# Patient Record
Sex: Female | Born: 1990 | Race: White | Hispanic: No | Marital: Married | Smoking: Never smoker
Health system: Southern US, Community
[De-identification: ages and names within clinical notes are randomized; demographics above are authoritative.]

## PROBLEM LIST (undated history)

## (undated) DIAGNOSIS — N83209 Unspecified ovarian cyst, unspecified side: Secondary | ICD-10-CM

## (undated) DIAGNOSIS — Z8619 Personal history of other infectious and parasitic diseases: Secondary | ICD-10-CM

## (undated) HISTORY — PX: NO PAST SURGERIES: SHX2092

## (undated) HISTORY — DX: Personal history of other infectious and parasitic diseases: Z86.19

---

## 2013-12-10 ENCOUNTER — Encounter (HOSPITAL_COMMUNITY): Payer: Self-pay | Admitting: *Deleted

## 2013-12-10 ENCOUNTER — Inpatient Hospital Stay (HOSPITAL_COMMUNITY): Payer: BC Managed Care – PPO

## 2013-12-10 ENCOUNTER — Inpatient Hospital Stay (HOSPITAL_COMMUNITY)
Admission: AD | Admit: 2013-12-10 | Discharge: 2013-12-10 | Disposition: A | Discharge status: Home / Self Care | Payer: BC Managed Care – PPO | Source: Ambulatory Visit | Attending: Family Medicine | Admitting: Family Medicine

## 2013-12-10 DIAGNOSIS — O209 Hemorrhage in early pregnancy, unspecified: Secondary | ICD-10-CM | POA: Insufficient documentation

## 2013-12-10 DIAGNOSIS — O4690 Antepartum hemorrhage, unspecified, unspecified trimester: Secondary | ICD-10-CM

## 2013-12-10 HISTORY — DX: Unspecified ovarian cyst, unspecified side: N83.209

## 2013-12-10 LAB — CBC
HCT: 36.9 % (ref 36.0–46.0)
HEMOGLOBIN: 12.8 g/dL (ref 12.0–15.0)
MCH: 31.9 pg (ref 26.0–34.0)
MCHC: 34.7 g/dL (ref 30.0–36.0)
MCV: 92 fL (ref 78.0–100.0)
Platelets: 298 10*3/uL (ref 150–400)
RBC: 4.01 MIL/uL (ref 3.87–5.11)
RDW: 12.6 % (ref 11.5–15.5)
WBC: 4.6 10*3/uL (ref 4.0–10.5)

## 2013-12-10 LAB — WET PREP, GENITAL
Clue Cells Wet Prep HPF POC: NONE SEEN
TRICH WET PREP: NONE SEEN
WBC WET PREP: NONE SEEN
Yeast Wet Prep HPF POC: NONE SEEN

## 2013-12-10 LAB — HCG, QUANTITATIVE, PREGNANCY: hCG, Beta Chain, Quant, S: 29 m[IU]/mL — ABNORMAL HIGH (ref <5)

## 2013-12-10 LAB — ABO/RH: ABO/RH(D): A POS

## 2013-12-10 NOTE — Discharge Instructions (Signed)
Vaginal Bleeding During Pregnancy, First Trimester  A small amount of bleeding (spotting) from the vagina is relatively common in early pregnancy. It usually stops on its own. Various things may cause bleeding or spotting in early pregnancy. Some bleeding may be related to the pregnancy, and some may not. In most cases, the bleeding is normal and is not a problem. However, bleeding can also be a sign of something serious. Be sure to tell your health care provider about any vaginal bleeding right away.  Some possible causes of vaginal bleeding during the first trimester include:  · Infection or inflammation of the cervix.  · Growths (polyps) on the cervix.  · Miscarriage or threatened miscarriage.  · Pregnancy tissue has developed outside of the uterus and in a fallopian tube (tubal pregnancy).  · Tiny cysts have developed in the uterus instead of pregnancy tissue (molar pregnancy).  HOME CARE INSTRUCTIONS   Watch your condition for any changes. The following actions may help to lessen any discomfort you are feeling:  · Follow your health care provider's instructions for limiting your activity. If your health care provider orders bed rest, you may need to stay in bed and only get up to use the bathroom. However, your health care provider may allow you to continue light activity.  · If needed, make plans for someone to help with your regular activities and responsibilities while you are on bed rest.  · Keep track of the number of pads you use each day, how often you change pads, and how soaked (saturated) they are. Write this down.  · Do not use tampons. Do not douche.  · Do not have sexual intercourse or orgasms until approved by your health care provider.  · If you pass any tissue from your vagina, save the tissue so you can show it to your health care provider.  · Only take over-the-counter or prescription medicines as directed by your health care provider.  · Do not take aspirin because it can make you  bleed.  · Keep all follow-up appointments as directed by your health care provider.  SEEK MEDICAL CARE IF:  · You have any vaginal bleeding during any part of your pregnancy.  · You have cramps or labor pains.  · You have a fever, not controlled by medicine.  SEEK IMMEDIATE MEDICAL CARE IF:   · You have severe cramps in your back or belly (abdomen).  · You pass large clots or tissue from your vagina.  · Your bleeding increases.  · You feel light-headed or weak, or you have fainting episodes.  · You have chills.  · You are leaking fluid or have a gush of fluid from your vagina.  · You pass out while having a bowel movement.  MAKE SURE YOU:  · Understand these instructions.  · Will watch your condition.  · Will get help right away if you are not doing well or get worse.  Document Released: 12/13/2004 Document Revised: 03/10/2013 Document Reviewed: 11/10/2012  ExitCare® Patient Information ©2015 [employer]. This information is not intended to replace advice given to you by your health care provider. Make sure you discuss any questions you have with your health care provider.

## 2013-12-10 NOTE — MAU Provider Note (Signed)
Chief Complaint: Vaginal Bleeding   First Provider Initiated Contact with Patient 12/10/13 0954     SUBJECTIVE HPI: Colleen Barrett is a 23 y.o. G0P0 at 5.4 weeks by certain, regular LMP who presents with pos home UPT x 3 w/  Bleeding and passing clots yesterday, lighter bleeding today. Denies abd pain.   Past Medical History  Diagnosis Date  . Ruptured ovarian cyst    OB History  Gravida Para Term Preterm AB SAB TAB Ectopic Multiple Living  1         0    # Outcome Date GA Lbr Len/2nd Weight Sex Delivery Anes PTL Lv  1 CUR              Past Surgical History  Procedure Laterality Date  . No past surgeries     History   Social History  . Marital Status: Married    Spouse Name: N/A    Number of Children: N/A  . Years of Education: N/A   Occupational History  . Not on file.   Social History Main Topics  . Smoking status: Never Smoker   . Smokeless tobacco: Not on file  . Alcohol Use: Yes     Comment: socially  . Drug Use: No  . Sexual Activity: Yes    Birth Control/ Protection: None   Other Topics Concern  . Not on file   Social History Narrative  . No narrative on file   No current facility-administered medications on file prior to encounter.   No current outpatient prescriptions on file prior to encounter.   No Known Allergies  ROS: Pertinent items in HPI  OBJECTIVE Blood pressure 118/75, pulse 79, temperature 98.4 F (36.9 C), temperature source Oral, resp. rate 16, height  (1.651 m), weight 63.504 kg (140 lb), last menstrual period 11/01/2013, SpO2 100.00%. GENERAL: Well-developed, well-nourished female in no acute distress.  HEENT: Normocephalic HEART: normal rate RESP: normal effort ABDOMEN: Soft, non-tender EXTREMITIES: Nontender, no edema NEURO: Alert and oriented SPECULUM EXAM: NEFG, small amount of bright red blood noted, cervix clean BIMANUAL: cervix closed; uterus normal size, no adnexal tenderness or masses. No CMT.   LAB  RESULTS Results for orders placed during the hospital encounter of 12/10/13 (from the past 24 hour(s))  HCG, QUANTITATIVE, PREGNANCY     Status: Abnormal   Collection Time    12/10/13 10:00 AM      Result Value Ref Range   hCG, Beta Chain, Quant, S 29 (*) <5 mIU/mL  CBC     Status: None   Collection Time    12/10/13 10:00 AM      Result Value Ref Range   WBC 4.6  4.0 - 10.5 K/uL   RBC 4.01  3.87 - 5.11 MIL/uL   Hemoglobin 12.8  12.0 - 15.0 g/dL   HCT 40.9  81.1 - 91.4 %   MCV 92.0  78.0 - 100.0 fL   MCH 31.9  26.0 - 34.0 pg   MCHC 34.7  30.0 - 36.0 g/dL   RDW 78.2  95.6 - 21.3 %   Platelets 298  150 - 400 K/uL  ABO/RH     Status: None   Collection Time    12/10/13 10:00 AM      Result Value Ref Range   ABO/RH(D) A POS    WET PREP, GENITAL     Status: None   Collection Time    12/10/13 12:15 PM      Result Value Ref  Range   Yeast Wet Prep HPF POC NONE SEEN  NONE SEEN   Trich, Wet Prep NONE SEEN  NONE SEEN   Clue Cells Wet Prep HPF POC NONE SEEN  NONE SEEN   WBC, Wet Prep HPF POC NONE SEEN  NONE SEEN    IMAGING US Ob Comp Less 14 Wks  12/10/2013   CLINICAL DATA:  Pregnancy.  Bleeding.  EXAM: OBSTETRIC <14 WK [place] AND TRANSVAGINAL OB US  TECHNIQUE: Both transabdominal and transvaginal ultrasound examinations were performed for complete evaluation of the gestation as well as the maternal uterus, adnexal regions, and pelvic cul-de-sac. Transvaginal technique was performed to assess early pregnancy.  COMPARISON:  None.  FINDINGS: Intrauterine gestational sac: None visualized.  Yolk sac:  None visualized.  Embryo:  None visualized.  Maternal uterus/adnexae: No significant abnormality. Critical tiny corpus luteal cyst on the right. No free pelvic fluid.  IMPRESSION: No intrauterine pregnancy identified. Findings are suspicious but not yet definitive for failed pregnancy. Recommend follow-up US in 10-14 days for definitive diagnosis. This recommendation follows SRU consensus guidelines:  Diagnostic Criteria for Nonviable Pregnancy Early in the First Trimester. Malva Limes Med 2013; 161:0960-45.   Electronically Signed   By: Maisie Fus  Register   On: 12/10/2013 11:54   US Ob Transvaginal  12/10/2013   CLINICAL DATA:  Pregnancy.  Bleeding.  EXAM: OBSTETRIC <14 WK [place] AND TRANSVAGINAL OB US  TECHNIQUE: Both transabdominal and transvaginal ultrasound examinations were performed for complete evaluation of the gestation as well as the maternal uterus, adnexal regions, and pelvic cul-de-sac. Transvaginal technique was performed to assess early pregnancy.  COMPARISON:  None.  FINDINGS: Intrauterine gestational sac: None visualized.  Yolk sac:  None visualized.  Embryo:  None visualized.  Maternal uterus/adnexae: No significant abnormality. Critical tiny corpus luteal cyst on the right. No free pelvic fluid.  IMPRESSION: No intrauterine pregnancy identified. Findings are suspicious but not yet definitive for failed pregnancy. Recommend follow-up US in 10-14 days for definitive diagnosis. This recommendation follows SRU consensus guidelines: Diagnostic Criteria for Nonviable Pregnancy Early in the First Trimester. Malva Limes Med 2013; 409:8119-14.   Electronically Signed   By: Maisie Fus  Register   On: 12/10/2013 11:54    MAU COURSE  ASSESSMENT 1. Vaginal bleeding during pregnancy, antepartum     PLAN Discharge home in stable condition. SAB and ectopic precautions. Bleeding precautions     Follow-up Information   Follow up with [facility]. (in 2 days or sooner as needed in emergencies)    Contact information:   [address] [id number] [place] [address] [phone]       Medication List         prenatal multivitamin Tabs tablet  Take 1 tablet by mouth daily at 12 noon.         [place], [place] 12/10/2013  12:47 PM

## 2013-12-10 NOTE — MAU Note (Signed)
Pt states she has had positive home UPT x 3 last week.  Started experiencing vaginal bleeding yesterday afternoon and passed some clots throughout the night.  Last intercourse was Tues and bleeding began on Wed.

## 2013-12-10 NOTE — MAU Provider Note (Signed)
Attestation of Attending Supervision of Advanced Practitioner (PA/CNM/NP): Evaluation and management procedures were performed by the Advanced Practitioner under my supervision and collaboration.  I have reviewed the Advanced Practitioner's note and chart, and I agree with the management and plan.  Bookert Guzzi S, MD [facility] Attending 12/10/2013 5:18 PM   

## 2013-12-11 LAB — GC/CHLAMYDIA PROBE AMP
CT Probe RNA: NEGATIVE
GC Probe RNA: NEGATIVE

## 2013-12-12 ENCOUNTER — Inpatient Hospital Stay (HOSPITAL_COMMUNITY)
Admission: AD | Admit: 2013-12-12 | Discharge: 2013-12-12 | Disposition: A | Discharge status: Home / Self Care | Payer: BC Managed Care – PPO | Source: Ambulatory Visit | Attending: Obstetrics & Gynecology | Admitting: Obstetrics & Gynecology

## 2013-12-12 DIAGNOSIS — O039 Complete or unspecified spontaneous abortion without complication: Secondary | ICD-10-CM | POA: Insufficient documentation

## 2013-12-12 DIAGNOSIS — O209 Hemorrhage in early pregnancy, unspecified: Secondary | ICD-10-CM | POA: Diagnosis present

## 2013-12-12 LAB — HCG, QUANTITATIVE, PREGNANCY: HCG, BETA CHAIN, QUANT, S: 5 m[IU]/mL — AB (ref <5)

## 2013-12-12 NOTE — MAU Provider Note (Signed)
S:  Colleen Barrett is a 23 y.o. female G1P0 at [redacted]w[redacted]d who presents for a follow up beta hcg level. She was seen two days ago in MAU with vaginal bleeding. She currently denies pain, however continues to have light vaginal bleeding.    O:  GENERAL: Well-developed, well-nourished female in no acute distress, accompanied by her significant other.  HEENT: Normocephalic, atraumatic.   LUNGS: Effort normal HEART: Regular rate  SKIN: Warm, dry and without erythema PSYCH: Normal mood and affect  Filed Vitals:   12/12/13 1052  BP: 121/68  Pulse: 67  Temp: 97.9 F (36.6 C)  Resp: 18    Results for orders placed during the hospital encounter of 12/12/13 (from the past 48 hour(s))  HCG, QUANTITATIVE, PREGNANCY     Status: Abnormal   Collection Time    12/12/13 10:28 AM      Result Value Ref Range   hCG, Beta Chain, Quant, S 5 (*) <5 mIU/mL   Comment:              GEST. AGE      CONC.  (mIU/mL)       <=1 WEEK        5 - 50         2 WEEKS       50 - 500         3 WEEKS       100 - 10,000         4 WEEKS     1,000 - 30,000         5 WEEKS     3,500 - 115,000       6-8 WEEKS     12,000 - 270,000        12 WEEKS     15,000 - 220,000                FEMALE AND NON-PREGNANT FEMALE:         LESS THAN 5 mIU/mL     MDM: Beta hcg 9/24: 29 O positive blood type    A:  1. SAB (spontaneous abortion)     P:  Discharge home in stable condition Bleeding precautions Support given Follow up in the clinic in 1-2 weeks. Return to MAU if symptoms worsen    Debbrah Alar, NP. 11:25 AM 12/12/2013

## 2013-12-12 NOTE — MAU Provider Note (Signed)
Attestation of Attending Supervision of Advanced Practitioner (CNM/NP): Evaluation and management procedures were performed by the Advanced Practitioner under my supervision and collaboration.  I have reviewed the Advanced Practitioner's note and chart, and I agree with the management and plan.  HARRAWAY-SMITH, Montario Zilka 2:32 PM

## 2013-12-12 NOTE — Discharge Instructions (Signed)
Miscarriage °A miscarriage is the loss of an unborn baby (fetus) before the 20th week of pregnancy. The cause is often unknown.  °HOME CARE °· You may need to stay in bed (bed rest), or you may be able to do light activity. Go about activity as told by your doctor. °· Have help at home. °· Write down how many pads you use each day. Write down how soaked they are. °· Do not use tampons. Do not wash out your vagina (douche) or have sex (intercourse) until your doctor approves. °· Only take medicine as told by your doctor. °· Do not take aspirin. °· Keep all doctor visits as told. °· If you or your partner have problems with grieving, talk to your doctor. You can also try counseling. Give yourself time to grieve before trying to get pregnant again. °GET HELP RIGHT AWAY IF: °· You have bad cramps or pain in your back or belly (abdomen). °· You have a fever. °· You pass large clumps of blood (clots) from your vagina that are walnut-sized or larger. Save the clumps for your doctor to see. °· You pass large amounts of tissue from your vagina. Save the tissue for your doctor to see. °· You have more bleeding. °· You have thick, bad-smelling fluid (discharge) coming from the vagina. °· You get lightheaded, weak, or you pass out (faint). °· You have chills. °MAKE SURE YOU: °· Understand these instructions. °· Will watch your condition. °· Will get help right away if you are not doing well or get worse. °Document Released: 05/28/2011 Document Reviewed: 05/28/2011 °ExitCare® Patient Information ©2015 [employer]. This information is not intended to replace advice given to you by your health care provider. Make sure you discuss any questions you have with your health care provider. ° °

## 2013-12-12 NOTE — MAU Note (Signed)
Pt states here for repeat BHCG. Bleeding is slowing down now. Denies pain at present.

## 2014-01-04 ENCOUNTER — Encounter: Payer: BC Managed Care – PPO | Admitting: Obstetrics & Gynecology

## 2014-01-19 ENCOUNTER — Encounter (HOSPITAL_COMMUNITY): Payer: Self-pay | Admitting: *Deleted

## 2014-03-19 NOTE — L&D Delivery Note (Signed)
Delivery Note At 5:43 PM a viable and healthy female was delivered via Vaginal, Spontaneous Delivery (Presentation: ; Occiput Anterior).  APGAR: 8, 9; weight  .   Placenta status: Intact, Spontaneous.  Cord: 3 vessels with the following complications: None.  Cord pH: not done  Anesthesia: Epidural  Episiotomy: None Lacerations: Periurethral;1st degree Suture Repair: vicryl rapide 4-O Est. Blood Loss (mL):    Mom to postpartum.  Baby to Couplet care / Skin to Skin.  Cecilia Worema Banga 10/22/2014, 6:04 PM

## 2014-03-23 LAB — OB RESULTS CONSOLE GC/CHLAMYDIA
CHLAMYDIA, DNA PROBE: NEGATIVE
GC PROBE AMP, GENITAL: NEGATIVE

## 2014-03-23 LAB — OB RESULTS CONSOLE ANTIBODY SCREEN: Antibody Screen: NEGATIVE

## 2014-03-23 LAB — OB RESULTS CONSOLE RUBELLA ANTIBODY, IGM: Rubella: IMMUNE

## 2014-03-23 LAB — OB RESULTS CONSOLE HIV ANTIBODY (ROUTINE TESTING): HIV: NONREACTIVE

## 2014-03-23 LAB — OB RESULTS CONSOLE ABO/RH: RH Type: POSITIVE

## 2014-03-23 LAB — OB RESULTS CONSOLE HEPATITIS B SURFACE ANTIGEN: HEP B S AG: NEGATIVE

## 2014-03-23 LAB — OB RESULTS CONSOLE RPR: RPR: NONREACTIVE

## 2014-09-16 LAB — OB RESULTS CONSOLE GBS: GBS: NEGATIVE

## 2014-10-08 ENCOUNTER — Encounter (HOSPITAL_COMMUNITY): Payer: Self-pay | Admitting: *Deleted

## 2014-10-08 ENCOUNTER — Telehealth (HOSPITAL_COMMUNITY): Payer: Self-pay | Admitting: *Deleted

## 2014-10-08 NOTE — Telephone Encounter (Signed)
Preadmission screen  

## 2014-10-15 ENCOUNTER — Encounter (HOSPITAL_COMMUNITY): Payer: Self-pay | Admitting: *Deleted

## 2014-10-18 ENCOUNTER — Inpatient Hospital Stay (HOSPITAL_COMMUNITY): Admission: RE | Admit: 2014-10-18 | Payer: Self-pay | Source: Ambulatory Visit

## 2014-10-21 ENCOUNTER — Encounter (HOSPITAL_COMMUNITY): Payer: Self-pay | Admitting: *Deleted

## 2014-10-21 ENCOUNTER — Inpatient Hospital Stay (HOSPITAL_COMMUNITY)
Admission: AD | Admit: 2014-10-21 | Discharge: 2014-10-24 | DRG: 775 | Disposition: A | Discharge status: Home / Self Care | Payer: 59 | Source: Ambulatory Visit | Attending: Obstetrics and Gynecology | Admitting: Obstetrics and Gynecology

## 2014-10-21 DIAGNOSIS — Z3A40 40 weeks gestation of pregnancy: Secondary | ICD-10-CM | POA: Diagnosis present

## 2014-10-21 DIAGNOSIS — O4292 Full-term premature rupture of membranes, unspecified as to length of time between rupture and onset of labor: Principal | ICD-10-CM | POA: Diagnosis present

## 2014-10-21 LAB — CBC
HCT: 40.2 % (ref 36.0–46.0)
HEMOGLOBIN: 13.8 g/dL (ref 12.0–15.0)
MCH: 31.7 pg (ref 26.0–34.0)
MCHC: 34.3 g/dL (ref 30.0–36.0)
MCV: 92.4 fL (ref 78.0–100.0)
Platelets: 264 10*3/uL (ref 150–400)
RBC: 4.35 MIL/uL (ref 3.87–5.11)
RDW: 13.8 % (ref 11.5–15.5)
WBC: 10.7 10*3/uL — AB (ref 4.0–10.5)

## 2014-10-21 LAB — POCT FERN TEST: POCT Fern Test: POSITIVE

## 2014-10-21 NOTE — MAU Provider Note (Signed)
S: Colleen Barrett is a 24 y.o. G2P0010 at [redacted]w[redacted]d who presents today with leaking of fluid. She states that she had a large gush of fluida round 2100. She has been having contractions all day.  She denies any VB. She confirms fetal movement. O: VSS, afebrile Abdomen: soft, non-tender, gravid External: no lesion Vagina: pooling of a large amount of fluid  Cervix: pink, smooth Uterus: AGA FHT: 135,moderate with 15x15 accels, no decels Toco: q4-5 mins  Results for orders placed or performed during the hospital encounter of 10/21/14 (from the past 24 hour(s))  Fern Test     Status: None   Collection Time: 10/21/14 11:24 PM  Result Value Ref Range   POCT Fern Test Positive = ruptured amniotic membanes    A/P: Exam for ROM RN will report to attending MD

## 2014-10-21 NOTE — MAU Note (Signed)
Reports ROM at 2109,  Contractions.

## 2014-10-22 ENCOUNTER — Inpatient Hospital Stay (HOSPITAL_COMMUNITY): Payer: 59 | Admitting: Anesthesiology

## 2014-10-22 ENCOUNTER — Encounter (HOSPITAL_COMMUNITY): Payer: Self-pay | Admitting: *Deleted

## 2014-10-22 LAB — RPR: RPR Ser Ql: NONREACTIVE

## 2014-10-22 LAB — TYPE AND SCREEN
ABO/RH(D): A POS
ANTIBODY SCREEN: NEGATIVE

## 2014-10-22 MED ORDER — OXYTOCIN 40 UNITS IN LACTATED RINGERS INFUSION - SIMPLE MED
62.5000 mL/h | INTRAVENOUS | Status: DC
  Filled 2014-10-22: qty 1000

## 2014-10-22 MED ORDER — SODIUM BICARBONATE 8.4 % IV SOLN
INTRAVENOUS | Status: DC | PRN
  Administered 2014-10-22: 5 mL via EPIDURAL

## 2014-10-22 MED ORDER — ACETAMINOPHEN 325 MG PO TABS: 650.0000 mg | ORAL_TABLET | ORAL | Status: DC | PRN

## 2014-10-22 MED ORDER — SIMETHICONE 80 MG PO CHEW: 80.0000 mg | CHEWABLE_TABLET | ORAL | Status: DC | PRN

## 2014-10-22 MED ORDER — FENTANYL 2.5 MCG/ML BUPIVACAINE 1/10 % EPIDURAL INFUSION (WH - ANES)
14.0000 mL/h | INTRAMUSCULAR | Status: DC | PRN
  Administered 2014-10-22 (×3): 14 mL/h via EPIDURAL
  Filled 2014-10-22 (×2): qty 125

## 2014-10-22 MED ORDER — CEFAZOLIN SODIUM-DEXTROSE 2-3 GM-% IV SOLR
2.0000 g | Freq: Once | INTRAVENOUS | Status: AC
  Administered 2014-10-22: 2 g via INTRAVENOUS
  Filled 2014-10-22: qty 50

## 2014-10-22 MED ORDER — LACTATED RINGERS IV SOLN
500.0000 mL | INTRAVENOUS | Status: DC | PRN
  Administered 2014-10-22: 500 mL via INTRAVENOUS

## 2014-10-22 MED ORDER — FLEET ENEMA 7-19 GM/118ML RE ENEM: 1.0000 | ENEMA | RECTAL | Status: DC | PRN

## 2014-10-22 MED ORDER — ONDANSETRON HCL 4 MG/2ML IJ SOLN: 4.0000 mg | Freq: Four times a day (QID) | INTRAMUSCULAR | Status: DC | PRN

## 2014-10-22 MED ORDER — TERBUTALINE SULFATE 1 MG/ML IJ SOLN
0.2500 mg | Freq: Once | INTRAMUSCULAR | Status: AC | PRN
  Filled 2014-10-22: qty 1

## 2014-10-22 MED ORDER — OXYTOCIN 40 UNITS IN LACTATED RINGERS INFUSION - SIMPLE MED
1.0000 m[IU]/min | INTRAVENOUS | Status: DC
  Administered 2014-10-22: 1 m[IU]/min via INTRAVENOUS

## 2014-10-22 MED ORDER — DIPHENHYDRAMINE HCL 50 MG/ML IJ SOLN: 12.5000 mg | INTRAMUSCULAR | Status: DC | PRN

## 2014-10-22 MED ORDER — TETANUS-DIPHTH-ACELL PERTUSSIS 5-2.5-18.5 LF-MCG/0.5 IM SUSP: 0.5000 mL | Freq: Once | INTRAMUSCULAR | Status: DC

## 2014-10-22 MED ORDER — DIBUCAINE 1 % RE OINT: 1.0000 "application " | TOPICAL_OINTMENT | RECTAL | Status: DC | PRN

## 2014-10-22 MED ORDER — ONDANSETRON HCL 4 MG PO TABS: 4.0000 mg | ORAL_TABLET | ORAL | Status: DC | PRN

## 2014-10-22 MED ORDER — OXYCODONE-ACETAMINOPHEN 5-325 MG PO TABS: 2.0000 | ORAL_TABLET | ORAL | Status: DC | PRN

## 2014-10-22 MED ORDER — IBUPROFEN 600 MG PO TABS
600.0000 mg | ORAL_TABLET | Freq: Four times a day (QID) | ORAL | Status: DC
  Administered 2014-10-22 – 2014-10-24 (×6): 600 mg via ORAL
  Filled 2014-10-22 (×6): qty 1

## 2014-10-22 MED ORDER — LACTATED RINGERS IV SOLN
INTRAVENOUS | Status: DC
  Administered 2014-10-22 (×2): via INTRAVENOUS

## 2014-10-22 MED ORDER — LANOLIN HYDROUS EX OINT: TOPICAL_OINTMENT | CUTANEOUS | Status: DC | PRN

## 2014-10-22 MED ORDER — EPHEDRINE 5 MG/ML INJ
10.0000 mg | INTRAVENOUS | Status: DC | PRN
  Filled 2014-10-22: qty 2

## 2014-10-22 MED ORDER — OXYTOCIN BOLUS FROM INFUSION
500.0000 mL | INTRAVENOUS | Status: DC
  Administered 2014-10-22: 500 mL via INTRAVENOUS

## 2014-10-22 MED ORDER — BENZOCAINE-MENTHOL 20-0.5 % EX AERO
1.0000 "application " | INHALATION_SPRAY | CUTANEOUS | Status: DC | PRN
  Administered 2014-10-22: 1 via TOPICAL
  Filled 2014-10-22: qty 56

## 2014-10-22 MED ORDER — PRENATAL MULTIVITAMIN CH
1.0000 | ORAL_TABLET | Freq: Every day | ORAL | Status: DC
  Administered 2014-10-23: 1 via ORAL
  Filled 2014-10-22: qty 1

## 2014-10-22 MED ORDER — CITRIC ACID-SODIUM CITRATE 334-500 MG/5ML PO SOLN: 30.0000 mL | ORAL | Status: DC | PRN

## 2014-10-22 MED ORDER — LIDOCAINE HCL (PF) 1 % IJ SOLN
30.0000 mL | INTRAMUSCULAR | Status: DC | PRN
  Filled 2014-10-22: qty 30

## 2014-10-22 MED ORDER — LIDOCAINE HCL (PF) 1 % IJ SOLN
INTRAMUSCULAR | Status: DC | PRN
  Administered 2014-10-22 (×2): 4 mL

## 2014-10-22 MED ORDER — SENNOSIDES-DOCUSATE SODIUM 8.6-50 MG PO TABS
2.0000 | ORAL_TABLET | ORAL | Status: DC
  Administered 2014-10-23: 2 via ORAL
  Filled 2014-10-22 (×2): qty 2

## 2014-10-22 MED ORDER — ZOLPIDEM TARTRATE 5 MG PO TABS: 5.0000 mg | ORAL_TABLET | Freq: Every evening | ORAL | Status: DC | PRN

## 2014-10-22 MED ORDER — ONDANSETRON HCL 4 MG/2ML IJ SOLN: 4.0000 mg | INTRAMUSCULAR | Status: DC | PRN

## 2014-10-22 MED ORDER — OXYCODONE-ACETAMINOPHEN 5-325 MG PO TABS
1.0000 | ORAL_TABLET | ORAL | Status: DC | PRN
Start: 2014-10-22 — End: 2014-10-24

## 2014-10-22 MED ORDER — DIPHENHYDRAMINE HCL 25 MG PO CAPS: 25.0000 mg | ORAL_CAPSULE | Freq: Four times a day (QID) | ORAL | Status: DC | PRN

## 2014-10-22 MED ORDER — OXYCODONE-ACETAMINOPHEN 5-325 MG PO TABS: 1.0000 | ORAL_TABLET | ORAL | Status: DC | PRN

## 2014-10-22 MED ORDER — PHENYLEPHRINE 40 MCG/ML (10ML) SYRINGE FOR IV PUSH (FOR BLOOD PRESSURE SUPPORT)
80.0000 ug | PREFILLED_SYRINGE | INTRAVENOUS | Status: DC | PRN
  Filled 2014-10-22: qty 2
  Filled 2014-10-22 (×2): qty 20

## 2014-10-22 MED ORDER — WITCH HAZEL-GLYCERIN EX PADS: 1.0000 "application " | MEDICATED_PAD | CUTANEOUS | Status: DC | PRN

## 2014-10-22 NOTE — Anesthesia Preprocedure Evaluation (Signed)
Anesthesia Evaluation  Patient identified by MRN, date of birth, ID band Patient awake    Reviewed: Allergy & Precautions, NPO status , Patient's Chart, lab work & pertinent test results  History of Anesthesia Complications Negative for: history of anesthetic complications  Airway Mallampati: II  TM Distance: >3 FB Neck ROM: Full    Dental no notable dental hx. (+) Dental Advisory Given   Pulmonary neg pulmonary ROS,  breath sounds clear to auscultation  Pulmonary exam normal       Cardiovascular negative cardio ROS Normal cardiovascular examRhythm:Regular Rate:Normal     Neuro/Psych negative neurological ROS  negative psych ROS   GI/Hepatic negative GI ROS, Neg liver ROS,   Endo/Other  negative endocrine ROS  Renal/GU negative Renal ROS  negative genitourinary   Musculoskeletal negative musculoskeletal ROS (+)   Abdominal   Peds negative pediatric ROS (+)  Hematology negative hematology ROS (+)   Anesthesia Other Findings   Reproductive/Obstetrics (+) Pregnancy                             Anesthesia Physical Anesthesia Plan  ASA: II  Anesthesia Plan: Epidural   Post-op Pain Management:    Induction:   Airway Management Planned:   Additional Equipment:   Intra-op Plan:   Post-operative Plan:   Informed Consent: I have reviewed the patients History and Physical, chart, labs and discussed the procedure including the risks, benefits and alternatives for the proposed anesthesia with the patient or authorized representative who has indicated his/her understanding and acceptance.   Dental advisory given  Plan Discussed with: CRNA  Anesthesia Plan Comments:         Anesthesia Quick Evaluation  

## 2014-10-22 NOTE — Progress Notes (Signed)
Patient ID: Colleen Barrett, female   DOB: [birth year], 24 y.o.   MRN: [id number] Thew pitocin was begun and is now on 7 mu/minute. Her contractions arte not regular and the RN checked her at [facility] and the cervix was 1 cm 60 % effaced and the vertex was - 2 station.I will be transferring her care to Dr. Mindi Slicker at 7 AM

## 2014-10-22 NOTE — H&P (Signed)
Colleen Barrett, Colleen Barrett                   ACCOUNT NO.:  [id number]  MEDICAL RECORD NO.:  [id number]  LOCATION:  9172                          FACILITY:  [facility]  PHYSICIAN:  Malachi Pro. Ambrose Mantle, M.D. DATE OF BIRTH:  1991-01-25  DATE OF ADMISSION:  10/22/2014 DATE OF DISCHARGE:                             HISTORY & PHYSICAL   HISTORY OF PRESENT ILLNESS:  This is a 24 year old  female, para 0-0-1-0, gravida 2, EDC October 17, 2014, admitted with premature rupture of the membranes.  This patient states she began having contractions every 5-6 minutes and at 9 p.m. noticed a gush of fluid from her vagina. She came to the hospital and was evaluated in maternity admission unit and was found to have ruptured membranes.  Blood group and type A positive, negative antibody, RPR negative.  Urine culture negative. Hepatitis B surface antigen negative, HIV negative, GC and Chlamydia negative.  Rubella immune.  Cystic fibrosis screen negative.  The patient declined other screening tests. Her 1 hour Glucola was 74. Repeat HIV and RPR were negative.  Repeat GC and chlamydia were negative and group B strep was negative.  The patient began her prenatal course early in pregnancy.  Ultrasound was done on March 23, 2014 giving her gestational age of [redacted] weeks 2 days with an [facility] of October 17, 2014, which was consistent with her last period and the [facility] was defined as October 17, 2014.  Her second ultrasound at 19 weeks showed normal anatomy and growth.  She declines screening tests.  She had an appropriate weight gain during pregnancy.  Her blood pressure remained normal.  On her last exam, October 15, 2014, the cervix was 0 cm dilated, 30% effaced, and vertex was at a -3.  The patient was scheduled for ripening on October 24, 2014, if she did not go into labor.  PAST MEDICAL HISTORY:  Reveals acne.  PAST SURGICAL HISTORY:  Wisdom teeth were extracted.  No other major surgeries.  ALLERGIES:  She has no known drug  allergies.  SOCIAL HISTORY:  She never smoked.  Does not drink.  Denies drugs.  FAMILY HISTORY:  Father has high blood pressure.  The patient has had 1 spontaneous abortion.  PHYSICAL EXAMINATION:  VITAL SIGNS:  On admission; well-developed, well- nourished female in no significant distress. VITAL SIGNS:  Temperature 98.7, pulse 70, respirations 16, blood pressure 120/58. HEART:  Normal size and sounds.  No murmurs. LUNGS:  Clear to auscultation. ABDOMEN:  Fundal height at her last prenatal visit was 39 cm.  Fetal heart tones are normal.  The patient is contracting about every 4-5 minutes, but they are not painful.  The heart rate appears normal. Cervix by the admitting RN in NIU was 1 cm, 50% effaced, and vertex at a -3.  ADMITTING IMPRESSION:  Intrauterine pregnancy at 40 weeks and 5 days with premature rupture of the membranes.  The patient will be evaluated to see if she is in labor, if she is not then Pitocin will be started.     Malachi Pro. Ambrose Mantle, M.D.     TFH/MEDQ  D:  10/22/2014  T:  10/22/2014  Job:  [id number]

## 2014-10-22 NOTE — Progress Notes (Signed)
Colleen Barrett is a 24 y.o. G2P0010 at [redacted]w[redacted]d by ultrasound admitted for rupture of membranes  Subjective:   Objective: BP 100/55 mmHg  Pulse 78  Temp(Src) 98.5 F (36.9 C) (Oral)  Resp 18  Ht  (1.651 m)  Wt 75.751 kg (167 lb)  BMI 27.79 kg/m2  SpO2 100%  LMP 11/01/2013      FHT:  FHR: 145 bpm, variability: moderate,  accelerations:  Present,  decelerations:  Present early UC:   regular, every 3 minutes SVE:   Dilation: 3 Effacement (%): 90 Station: -2 Exam by:: Dr Mindi Slicker  Labs: Lab Results  Component Value Date   WBC 10.7* 10/21/2014   HGB 13.8 10/21/2014   HCT 40.2 10/21/2014   MCV 92.4 10/21/2014   PLT 264 10/21/2014    Assessment / Plan: Progressing well in labor; forebag noted and AROM performed with moderate return of clear fluid  Labor: Progressing normally on pitocin;AROM done Preeclampsia:  none Fetal Wellbeing:  Category I Pain Control:  Epidural I/D:  n/a Anticipated MOD:  NSVD  Colleen Barrett Colleen Barrett 10/22/2014, 12:37 PM

## 2014-10-22 NOTE — Progress Notes (Signed)
Colleen Barrett is a 24 y.o. G2P0010 at [redacted]w[redacted]d by ultrasound admitted for rupture of membranes  Subjective:   Objective: BP 125/64 mmHg  Pulse 78  Temp(Src) 98.8 F (37.1 C) (Oral)  Resp 18  Ht  (1.651 m)  Wt 75.751 kg (167 lb)  BMI 27.79 kg/m2  SpO2 99%  LMP 11/01/2013      FHT:  FHR: 145 bpm, variability: moderate,  accelerations:  Present,  decelerations:  Absent UC:   irregular, every 5 minutes SVE:   Dilation: 1 Effacement (%): 60 Station: -3 Exam by:: Colleen Shirts RN   Labs: Lab Results  Component Value Date   WBC 10.7* 10/21/2014   HGB 13.8 10/21/2014   HCT 40.2 10/21/2014   MCV 92.4 10/21/2014   PLT 264 10/21/2014    Assessment / Plan: spontaneous ROM at 1cm; now appreciating contractions, may begin to progress into active labor  Labor: Pt about to get epidural. Will reassess 30-105mins after for cervical change/progress Preeclampsia:  none Fetal Wellbeing:  Category I Pain Control:  Epidural I/D:  n/a Anticipated MOD:  NSVD  Colleen Barrett 10/22/2014, 8:36 AM

## 2014-10-22 NOTE — Progress Notes (Signed)
Patient ID: Colleen Barrett, female   DOB: 1990-07-28, 24 y.o.   MRN: [id number] We have observed the pt to see if labor would ensue and there does not appear to be a labor pattern. The RN will examine her and if the cervix is unchanged will start pitocin.

## 2014-10-22 NOTE — Anesthesia Procedure Notes (Signed)
Epidural Patient location during procedure: OB Start time: 10/22/2014 8:32 AM  Staffing Anesthesiologist: Karie Schwalbe Performed by: anesthesiologist   Preanesthetic Checklist Completed: patient identified, site marked, surgical consent, pre-op evaluation, timeout performed, IV checked, risks and benefits discussed and monitors and equipment checked  Epidural Patient position: sitting Prep: site prepped and draped and DuraPrep Patient monitoring: continuous pulse ox and blood pressure Approach: midline Location: L3-L4 Injection technique: LOR saline  Needle:  Needle type: Tuohy  Needle gauge: 17 G Needle length: 9 cm and 9 Needle insertion depth: 4.5 cm Catheter type: closed end flexible Catheter size: 19 Gauge Catheter at skin depth: 9 cm Test dose: negative  Assessment Events: blood not aspirated, injection not painful, no injection resistance, negative IV test and no paresthesia  Additional Notes Patient identified. Risks/Benefits/Options discussed with patient including but not limited to bleeding, infection, nerve damage, paralysis, failed block, incomplete pain control, headache, blood pressure changes, nausea, vomiting, reactions to medication both or allergic, itching and postpartum back pain. Confirmed with bedside nurse the patient's most recent platelet count. Confirmed with patient that they are not currently taking any anticoagulation, have any bleeding history or any family history of bleeding disorders. Patient expressed understanding and wished to proceed. All questions were answered. Sterile technique was used throughout the entire procedure. Please see nursing notes for vital signs. Test dose was given through epidural catheter and negative prior to continuing to dose epidural or start infusion. Warning signs of high block given to the patient including shortness of breath, tingling/numbness in hands, complete motor block, or any concerning symptoms with instructions to  call for help. Patient was given instructions on fall risk and not to get out of bed. All questions and concerns addressed with instructions to call with any issues or inadequate analgesia.

## 2014-10-23 LAB — CBC
HCT: 39.4 % (ref 36.0–46.0)
HEMOGLOBIN: 13.2 g/dL (ref 12.0–15.0)
MCH: 31.4 pg (ref 26.0–34.0)
MCHC: 33.5 g/dL (ref 30.0–36.0)
MCV: 93.8 fL (ref 78.0–100.0)
Platelets: 236 10*3/uL (ref 150–400)
RBC: 4.2 MIL/uL (ref 3.87–5.11)
RDW: 14.3 % (ref 11.5–15.5)
WBC: 16.2 10*3/uL — AB (ref 4.0–10.5)

## 2014-10-23 NOTE — Anesthesia Postprocedure Evaluation (Signed)
  Anesthesia Post-op Note  Patient: [name]  Procedure(s) Performed: * No procedures listed *    Anesthesia Type: Epidural  Level of Consciousness: awake and alert   Airway and Oxygen Therapy: Patient Spontanous Breathing  Post-op Pain: mild  Post-op Assessment: Post-op Vital signs reviewed, Patient's Cardiovascular Status Stable, Respiratory Function Stable, Patent Airway and No signs of Nausea or vomiting  Last Vitals:  Filed Vitals:   10/23/14 0852  BP: 108/76  Pulse: 59  Temp: 37.1 C  Resp: 18    Post-op Vital Signs: stable   Complications: No apparent anesthesia complications

## 2014-10-23 NOTE — Progress Notes (Signed)
Checked on Colleen Barrett this am and she reports that the numbness in her feet has completely resolved. She has been up walking. Very pleased with anesthesia care. Instructed to call back with any questions or concerns. -MJudd

## 2014-10-23 NOTE — Progress Notes (Signed)
Post Partum Day 1 Subjective: Pt s/e. Reports no fever, chills or headaches. Ambulated around room with no complaints. Tolerating PO. Working on breastfeeding and is bonding great with baby. Reports some difficulty with urine incontinence - cannot tell when baldder is full. Pain controlled and lochia mild.   Objective: Blood pressure 99/59, pulse 60, temperature 98.1 F (36.7 C), temperature source Oral, resp. rate 20, height  (1.651 m), weight 75.751 kg (167 lb), last menstrual period 11/01/2013, SpO2 98 %, unknown if currently breastfeeding.  Physical Exam:  General: alert, cooperative and no distress Lochia: appropriate Uterine Fundus: firm DVT Evaluation: Negative Homan's sign. Ted hose in place   Recent Labs  10/21/14 2313 10/23/14 0550  HGB 13.8 13.2  HCT 40.2 39.4    Assessment/Plan: Plan for discharge tomorrow, Breastfeeding, Lactation consult and Circumcision prior to discharge   LOS: 2 days   Colleen Barrett 10/23/2014, 8:19 AM

## 2014-10-23 NOTE — Lactation Note (Signed)
This note was copied from the chart of Colleen Barrett. Lactation Consultation Note Initial visit at 28 hours of age. Mom reports several good feeding, but baby has been sleepy.  Discussed cluster feeding for 2nd day of life.  FOB holding baby showing feeding cues.  Mom has semiflat nipples with compressible breast tissue.  Baby opens mouth wide with flanged lips and rhythmic sucking.  Mom denies pain.  Pillows used for support and discussed deep latch.   Observed 10 minutes and baby continues. [facility] LC resources given and discussed.  Encouraged to feed with early cues on demand.  Early newborn behavior discussed.  Hand expression demonstrated with colostrum visible.  Mom to call for assist as needed.    Patient Name: Colleen Tenesia Escudero [id number] Date: 10/23/2014 Reason for consult: Initial assessment   Maternal Data Has patient been taught Hand Expression?: Yes Does the patient have breastfeeding experience prior to this delivery?: No  Feeding Feeding Type: Breast Fed Length of feed:  (observed 10 minutes)  LATCH Score/Interventions Latch: Too sleepy or reluctant, no latch achieved, no sucking elicited. Intervention(s): Skin to skin;Teach feeding cues;Waking techniques  Audible Swallowing: A few with stimulation  Type of Nipple: Flat Intervention(s): No intervention needed  Comfort (Breast/Nipple): Soft / non-tender     Hold (Positioning): Assistance needed to correctly position infant at breast and maintain latch. Intervention(s): Breastfeeding basics reviewed;Support Pillows;Position options;Skin to skin  LATCH Score: 5  Lactation Tools Discussed/Used     Consult Status Consult Status: Follow-up Date: 10/24/14 Follow-up type: In-patient    Johnathan Tortorelli, Arvella Merles 10/23/2014, 10:32 PM

## 2014-10-24 ENCOUNTER — Inpatient Hospital Stay (HOSPITAL_COMMUNITY): Payer: Self-pay

## 2014-10-24 MED ORDER — IBUPROFEN 600 MG PO TABS: 600.0000 mg | ORAL_TABLET | Freq: Four times a day (QID) | ORAL | Status: AC

## 2014-10-24 NOTE — Progress Notes (Signed)
OB Discharge Summary  Patient Name: Colleen Barrett DOB: 03/25/1990 MRN: [id number]  Date of admission: 10/21/2014     Date of discharge: No discharge date for patient encounter.  Admitting diagnosis: SROM at term   Intrauterine pregnancy: [redacted]w[redacted]d     Secondary diagnosis: None     Discharge diagnosis: Term Pregnancy Delivered  Method of delivery:      Information for the patient's newborn:  Junella, Domke [[id number]]  Delivery Method: Vaginal, Spontaneous Delivery (Filed from Delivery Summary)                                                                                                     Intrapartum Procedures:                                                          Post partum procedures:none      Hospital course:  Onset of Labor With Vaginal Delivery     24 y.o. yo G2P1011 at [redacted]w[redacted]d was admitted in Latent Labor with SROM on 10/21/2014. Patient had an uncomplicated labor course as follows: Pt progressed on pitocin and with epidural to complete.       Length of Stage 3:                                    Mediations and procedures used include: Pitocin  Patient had a delivery of a Viable infant. 10/22/2014  Information for the patient's newborn:  Filicia, Scogin [[id number]]  Delivery Method: Vaginal, Spontaneous Delivery (Filed from Delivery Summary)    Pateint had an uncomplicated postpartum course.  She is ambulating, tolerating a regular diet, passing flatus, and urinating well. Patient is discharged home in stable condition on No discharge date for patient encounter.Marland Kitchen    Physical exam  Filed Vitals:   10/23/14 0530 10/23/14 0852 10/23/14 1747 10/24/14 0622  BP: 99/59 108/76 102/52 114/69  Pulse: 60 59 68 63  Temp: 98.1 F (36.7 C) 98.7 F (37.1 C) 97.8 F (36.6 C) 97.7 F (36.5 C)  TempSrc: Oral Oral Oral Oral  Resp: 20 18 18 16   Height:      Weight:      SpO2:  100%     General: alert, cooperative and no distress Lochia: appropriate Uterine Fundus:  firm DVT Evaluation: No evidence of DVT seen on physical exam. Labs: Lab Results  Component Value Date   WBC 16.2* 10/23/2014   HGB 13.2 10/23/2014   HCT 39.4 10/23/2014   MCV 93.8 10/23/2014   PLT 236 10/23/2014   No flowsheet data found.  Discharge instruction: per After Visit Summary and "Baby and Me Booklet".  Medications:   Medication List    ASK your doctor about these medications        prenatal multivitamin Tabs tablet  Take 1  tablet by mouth daily at 12 noon.        Diet: routine diet  Activity: Advance as tolerated. Pelvic rest for 6 weeks.   Outpatient follow up:6 weeks  Postpartum contraception: Undecided ( minipill vs depo)  Newborn Data: Live born female  Birth Weight: 7 lb 15.8 oz (3623 g) APGAR: 8, 9  Baby Feeding: Breast Disposition:home with mother   10/24/2014 [name] Marillyn Goren, DO

## 2014-10-24 NOTE — Discharge Instructions (Signed)
Nothing in vagina for 6 weeks.  No sex, tampons, and douching.  Other instructions as in [facility] Discharge Booklet. °

## 2014-10-24 NOTE — Discharge Summary (Signed)
Obstetric Discharge Summary Reason for Admission: rupture of membranes Prenatal Procedures: ultrasound and wnl Intrapartum Procedures: spontaneous vaginal delivery Postpartum Procedures: none Complications-Operative and Postpartum: 2 degree perineal laceration HEMOGLOBIN  Date Value Ref Range Status  10/23/2014 13.2 12.0 - 15.0 g/dL Final   HCT  Date Value Ref Range Status  10/23/2014 39.4 36.0 - 46.0 % Final    Physical Exam:  General: alert, cooperative and no distress Lochia: appropriate Uterine Fundus: firm  Discharge Diagnoses: Term Pregnancy-delivered  Discharge Information: Date: 10/24/2014 Activity: pelvic rest Diet: routine Medications: PNV and Ibuprofen Condition: stable Instructions: refer to practice specific booklet Discharge to: home Follow-up Information    Follow up with [facility] [place], DO. Call in 6 weeks.   Specialty:  Obstetrics and Gynecology   Why:  For postpartum visit   Contact information:   [address] [place] [address] [phone]       Newborn Data: Live born female  Birth Weight: 7 lb 15.8 oz (3623 g) APGAR: 8, 9  Home with mother.  Mare Loan Worema Banga 10/24/2014, 9:37 AM

## 2014-10-24 NOTE — Progress Notes (Signed)
Post Partum Day 2 Subjective: up ad lib, tolerating PO, + flatus and still having isues with voids - has some mild incontinence as cannot tell when bladder is full. Better today than yesterday  Objective: Blood pressure 114/69, pulse 63, temperature 97.7 F (36.5 C), temperature source Oral, resp. rate 16, height  (1.651 m), weight 75.751 kg (167 lb), last menstrual period 11/01/2013, SpO2 100 %, unknown if currently breastfeeding.  Physical Exam:  General: alert, cooperative and no distress Lochia: appropriate Uterine Fundus: firm DVT Evaluation: No evidence of DVT seen on physical exam.   Recent Labs  10/21/14 2313 10/23/14 0550  HGB 13.8 13.2  HCT 40.2 39.4    Assessment/Plan: Discharge home, Breastfeeding, Circumcision prior to discharge and Contraception considering minipill vs depo provera   LOS: 3 days   Colleen Barrett 10/24/2014, 8:52 AM

## 2014-10-25 ENCOUNTER — Inpatient Hospital Stay (HOSPITAL_COMMUNITY): Payer: 59

## 2015-01-27 ENCOUNTER — Other Ambulatory Visit: Payer: Self-pay | Admitting: Family Medicine

## 2015-01-27 ENCOUNTER — Other Ambulatory Visit (HOSPITAL_COMMUNITY)
Admission: RE | Admit: 2015-01-27 | Discharge: 2015-01-27 | Disposition: A | Discharge status: Home / Self Care | Payer: 59 | Source: Ambulatory Visit | Attending: Family Medicine | Admitting: Family Medicine

## 2015-01-27 DIAGNOSIS — Z01419 Encounter for gynecological examination (general) (routine) without abnormal findings: Secondary | ICD-10-CM | POA: Diagnosis not present

## 2015-01-28 LAB — CYTOLOGY - PAP

## 2015-02-03 IMAGING — US US OB COMP LESS 14 WK
1 series · 14 of 28 positions shown · non-contrast
Comparison: None.

CLINICAL DATA: Pregnancy.  Bleeding.

EXAM:
OBSTETRIC <14 WK US AND TRANSVAGINAL OB US
TECHNIQUE: Both transabdominal and transvaginal ultrasound examinations were
performed for complete evaluation of the gestation as well as the
maternal uterus, adnexal regions, and pelvic cul-de-sac.
Transvaginal technique was performed to assess early pregnancy.

[Series 1: us ob comp less 14 wks · 14 of 59 slices shown]
[im 3/59]
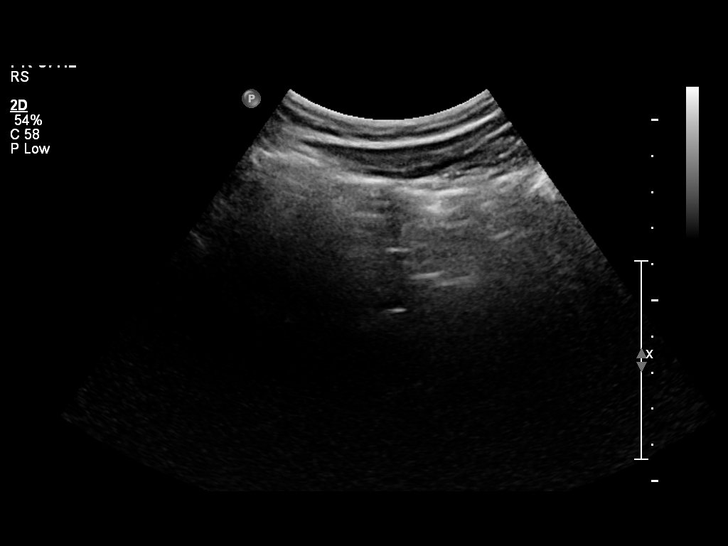
[im 7/59]
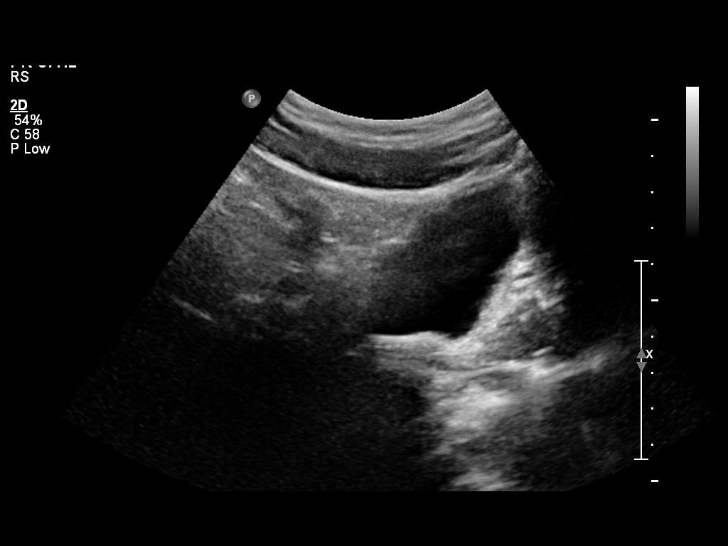
[im 11/59]
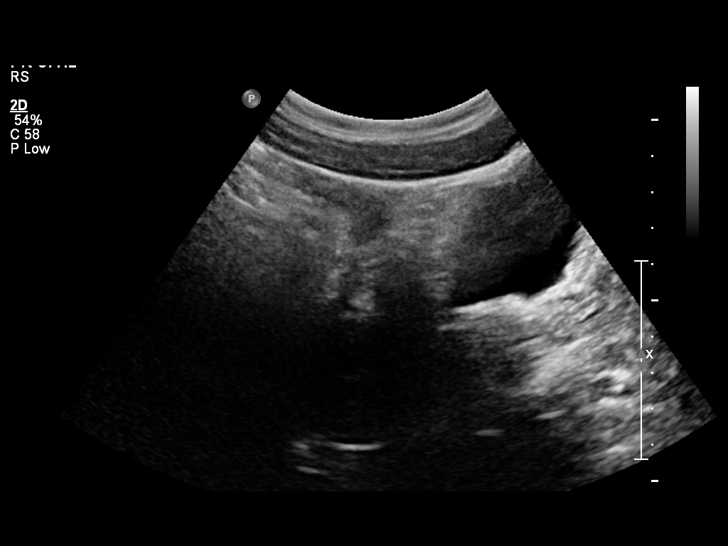
[im 16/59]
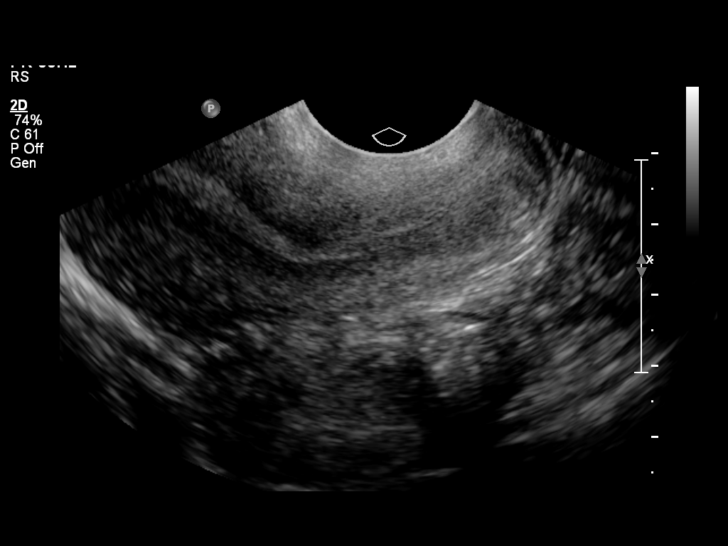
[im 20/59]
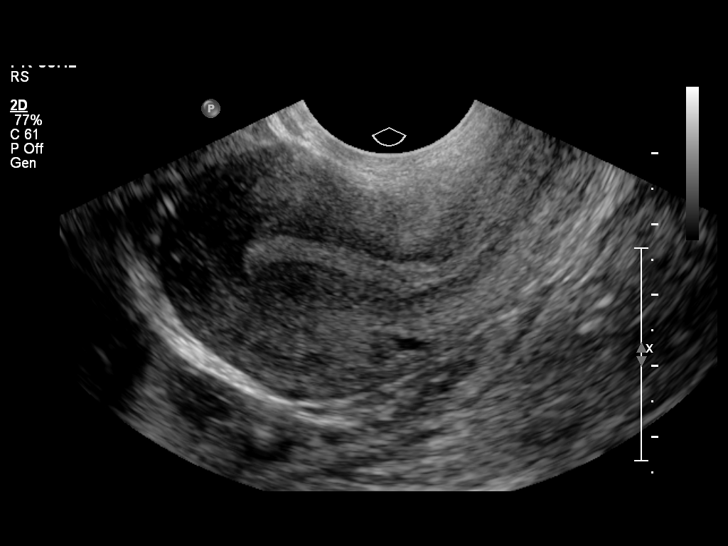
[im 24/59]
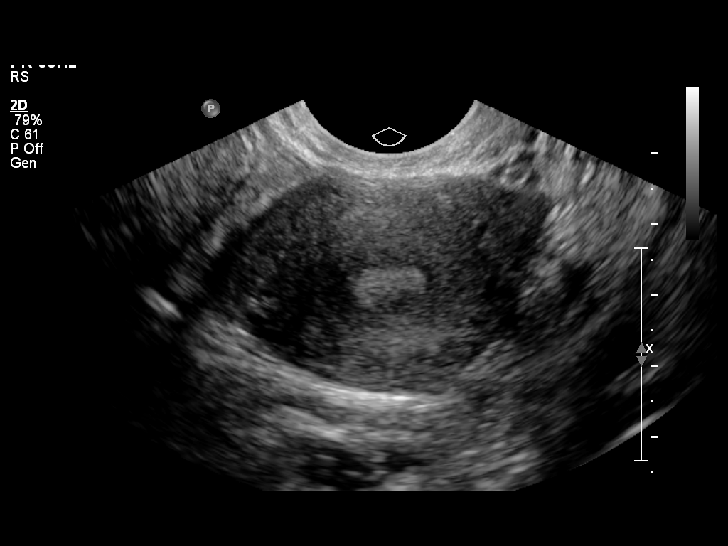
[im 28/59]
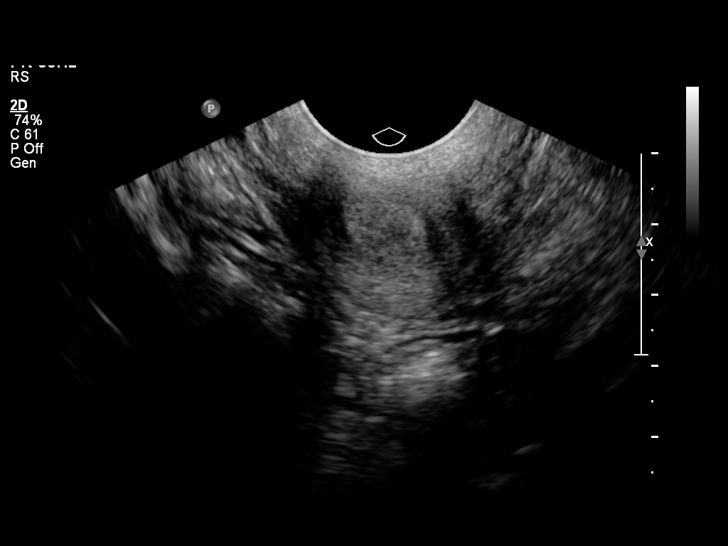
[im 33/59]
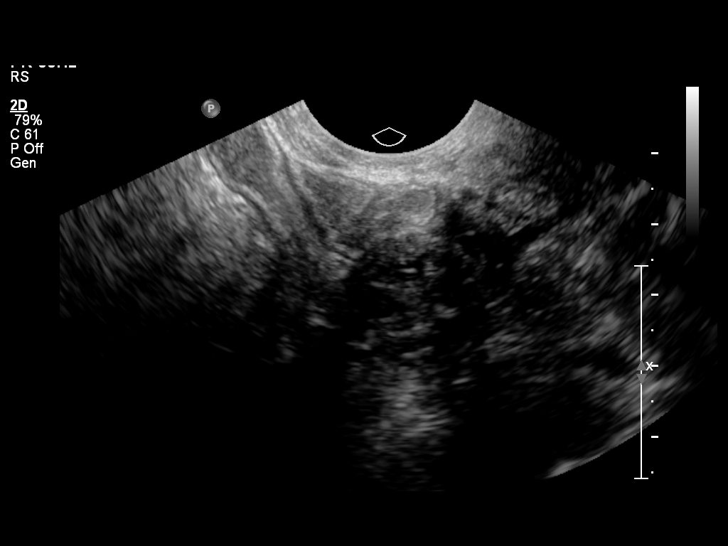
[im 37/59]
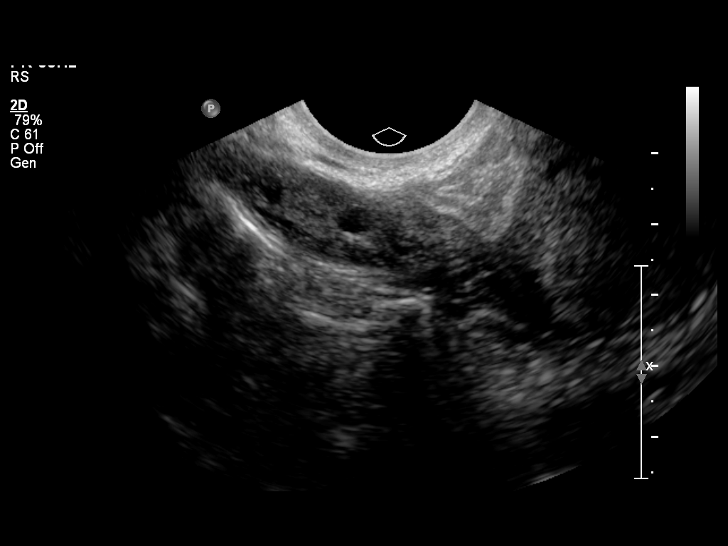
[im 41/59]
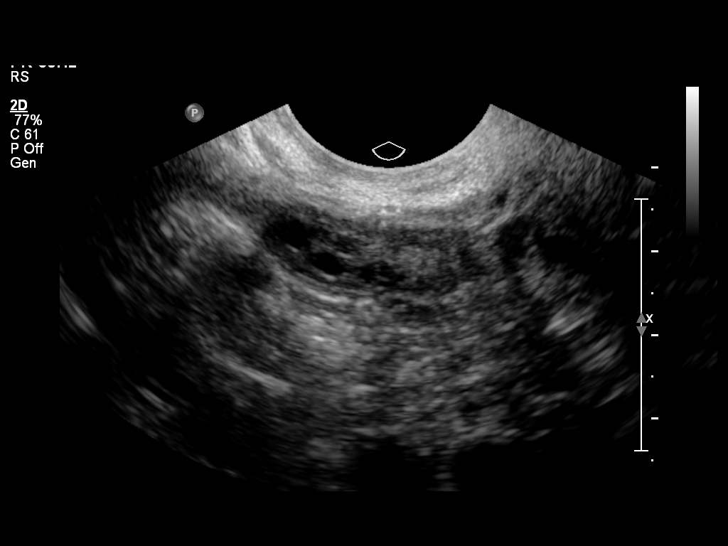
[im 46/59]
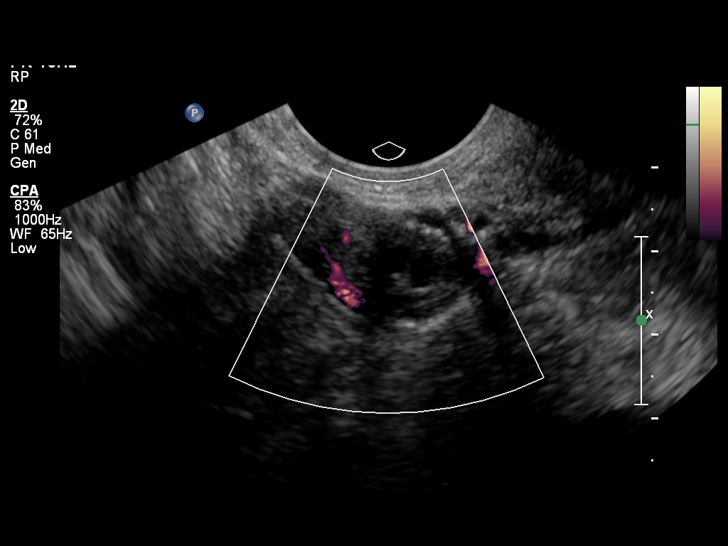
[im 50/59]
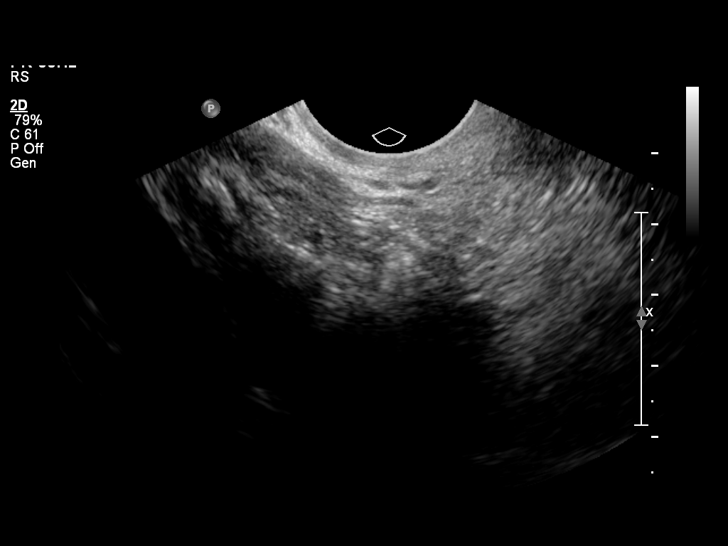
[im 54/59]
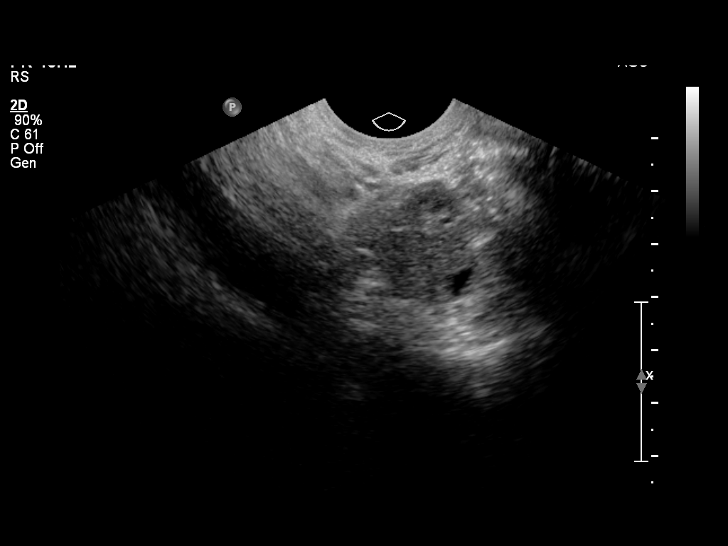
[im 59/59]
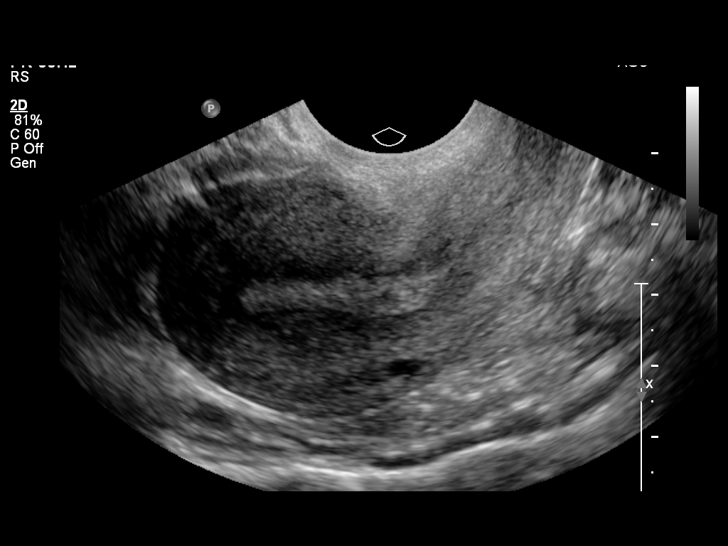

[14 of 28 positions shown; findings below may reference images not displayed]

FINDINGS: Intrauterine gestational sac: None visualized.

Yolk sac:  None visualized.

Embryo:  None visualized.

Maternal uterus/adnexae: No significant abnormality. Critical tiny
corpus luteal cyst on the right. No free pelvic fluid.
IMPRESSION: No intrauterine pregnancy identified. Findings are suspicious but
not yet definitive for failed pregnancy. Recommend follow-up US in
10-14 days for definitive diagnosis. This recommendation follows SRU
consensus guidelines: Diagnostic Criteria for Nonviable Pregnancy
Early in the First Trimester. N Engl J Med 8160; [DATE].

## 2018-02-20 ENCOUNTER — Other Ambulatory Visit: Payer: Self-pay | Admitting: Internal Medicine

## 2018-02-20 ENCOUNTER — Other Ambulatory Visit (HOSPITAL_COMMUNITY)
Admission: RE | Admit: 2018-02-20 | Discharge: 2018-02-20 | Disposition: A | Discharge status: Home / Self Care | Payer: Managed Care, Other (non HMO) | Source: Ambulatory Visit | Attending: Internal Medicine | Admitting: Internal Medicine

## 2018-02-20 DIAGNOSIS — Z01419 Encounter for gynecological examination (general) (routine) without abnormal findings: Secondary | ICD-10-CM | POA: Diagnosis not present

## 2018-02-21 LAB — CYTOLOGY - PAP: Diagnosis: NEGATIVE
